# Patient Record
Sex: Female | Born: 2000 | Race: White | Hispanic: No | Marital: Single | Smoking: Never smoker
Health system: Southern US, Community
[De-identification: ages and names within clinical notes are randomized; demographics above are authoritative.]

---
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
## 2023-08-09 ENCOUNTER — Other Ambulatory Visit (HOSPITAL_BASED_OUTPATIENT_CLINIC_OR_DEPARTMENT_OTHER): Payer: Self-pay | Admitting: Family

## 2023-08-09 ENCOUNTER — Ambulatory Visit (HOSPITAL_BASED_OUTPATIENT_CLINIC_OR_DEPARTMENT_OTHER)
Admission: RE | Admit: 2023-08-09 | Discharge: 2023-08-09 | Disposition: A | Discharge status: Home / Self Care | Source: Ambulatory Visit | Attending: Family | Admitting: Family

## 2023-08-09 DIAGNOSIS — M21372 Foot drop, left foot: Secondary | ICD-10-CM | POA: Diagnosis not present

## 2023-08-09 DIAGNOSIS — R208 Other disturbances of skin sensation: Secondary | ICD-10-CM | POA: Diagnosis not present
